# Patient Record
Sex: Male | Born: 1970 | Race: White | Hispanic: No | Marital: Single
Health system: Southern US, Community
[De-identification: ages and names within clinical notes are randomized; demographics above are authoritative.]

---

## 2013-06-01 ENCOUNTER — Inpatient Hospital Stay: Payer: Self-pay | Admitting: Specialist

## 2013-06-01 LAB — URINALYSIS, COMPLETE
Bacteria: NONE SEEN
Blood: NEGATIVE
Nitrite: NEGATIVE
Ph: 7 (ref 4.5–8.0)
Protein: NEGATIVE
Specific Gravity: 1.014 (ref 1.003–1.030)
WBC UR: 1 /HPF (ref 0–5)

## 2013-06-01 LAB — CBC
HCT: 44.9 % (ref 40.0–52.0)
HGB: 15 g/dL (ref 13.0–18.0)
MCH: 29.8 pg (ref 26.0–34.0)
MCHC: 33.4 g/dL (ref 32.0–36.0)
Platelet: 292 10*3/uL (ref 150–440)

## 2013-06-01 LAB — COMPREHENSIVE METABOLIC PANEL
Alkaline Phosphatase: 86 U/L (ref 50–136)
Anion Gap: 11 (ref 7–16)
Calcium, Total: 8.6 mg/dL (ref 8.5–10.1)
Creatinine: 0.97 mg/dL (ref 0.60–1.30)
EGFR (African American): >60
EGFR (Non-African Amer.): >60
Glucose: 174 mg/dL — ABNORMAL HIGH (ref 65–99)
Potassium: 2.7 mmol/L — ABNORMAL LOW (ref 3.5–5.1)
SGPT (ALT): 21 U/L (ref 12–78)

## 2013-06-01 LAB — PROTIME-INR
INR: 1
Prothrombin Time: 13.4 secs (ref 11.5–14.7)

## 2013-06-02 LAB — POTASSIUM: Potassium: 4.3 mmol/L (ref 3.5–5.1)

## 2013-06-03 LAB — HEMATOCRIT: HCT: 38.8 % — ABNORMAL LOW (ref 40.0–52.0)

## 2013-11-18 IMAGING — CR DG ANKLE COMPLETE 3+V*L*
1 series · 4 of 4 positions shown · non-contrast
Comparison: none

REASON FOR EXAM: ankle deformity s/p fall
COMMENTS:

[Series 1: ap · 0.17mm/px · 4 of 4 slices shown]
[im 1/4]
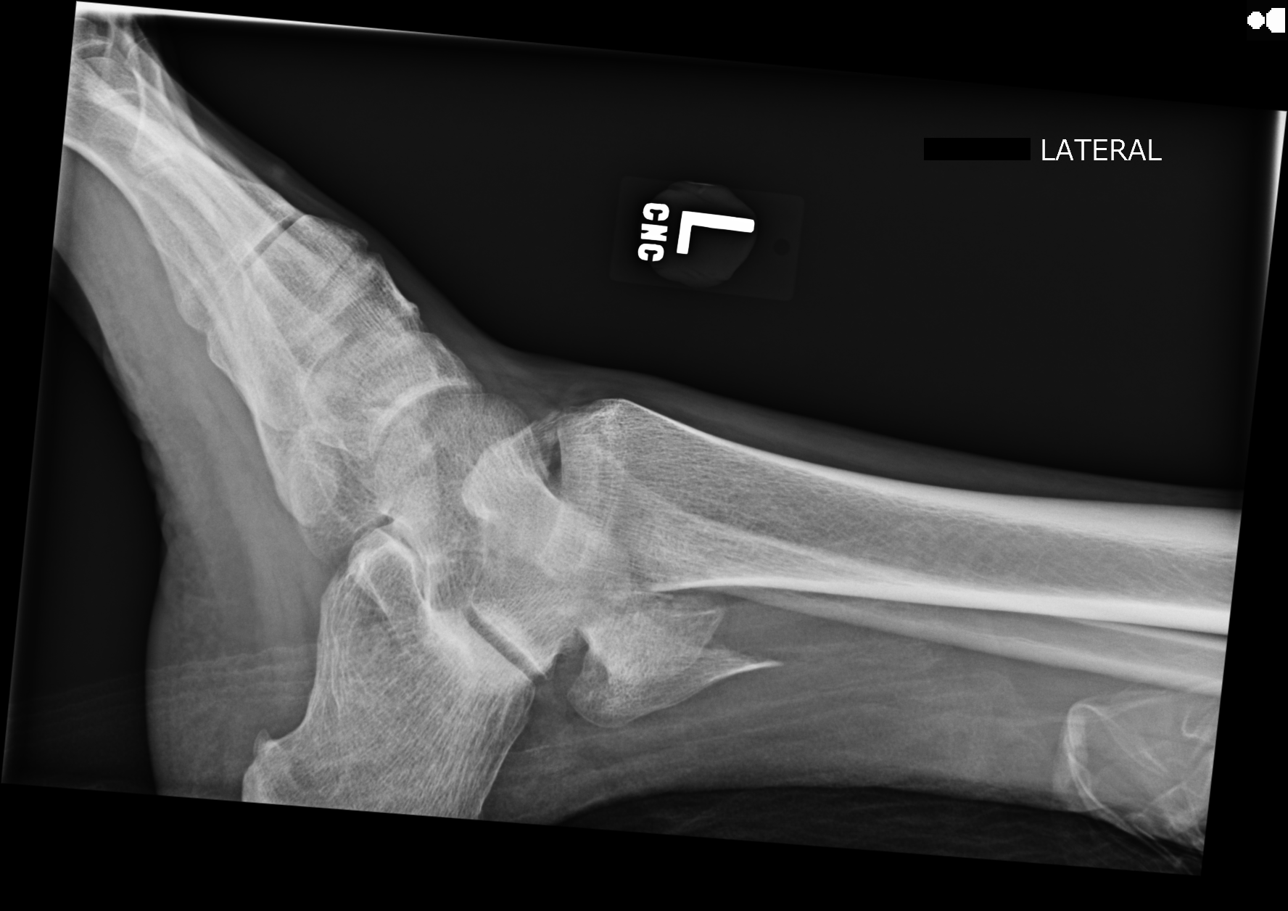
[im 2/4]
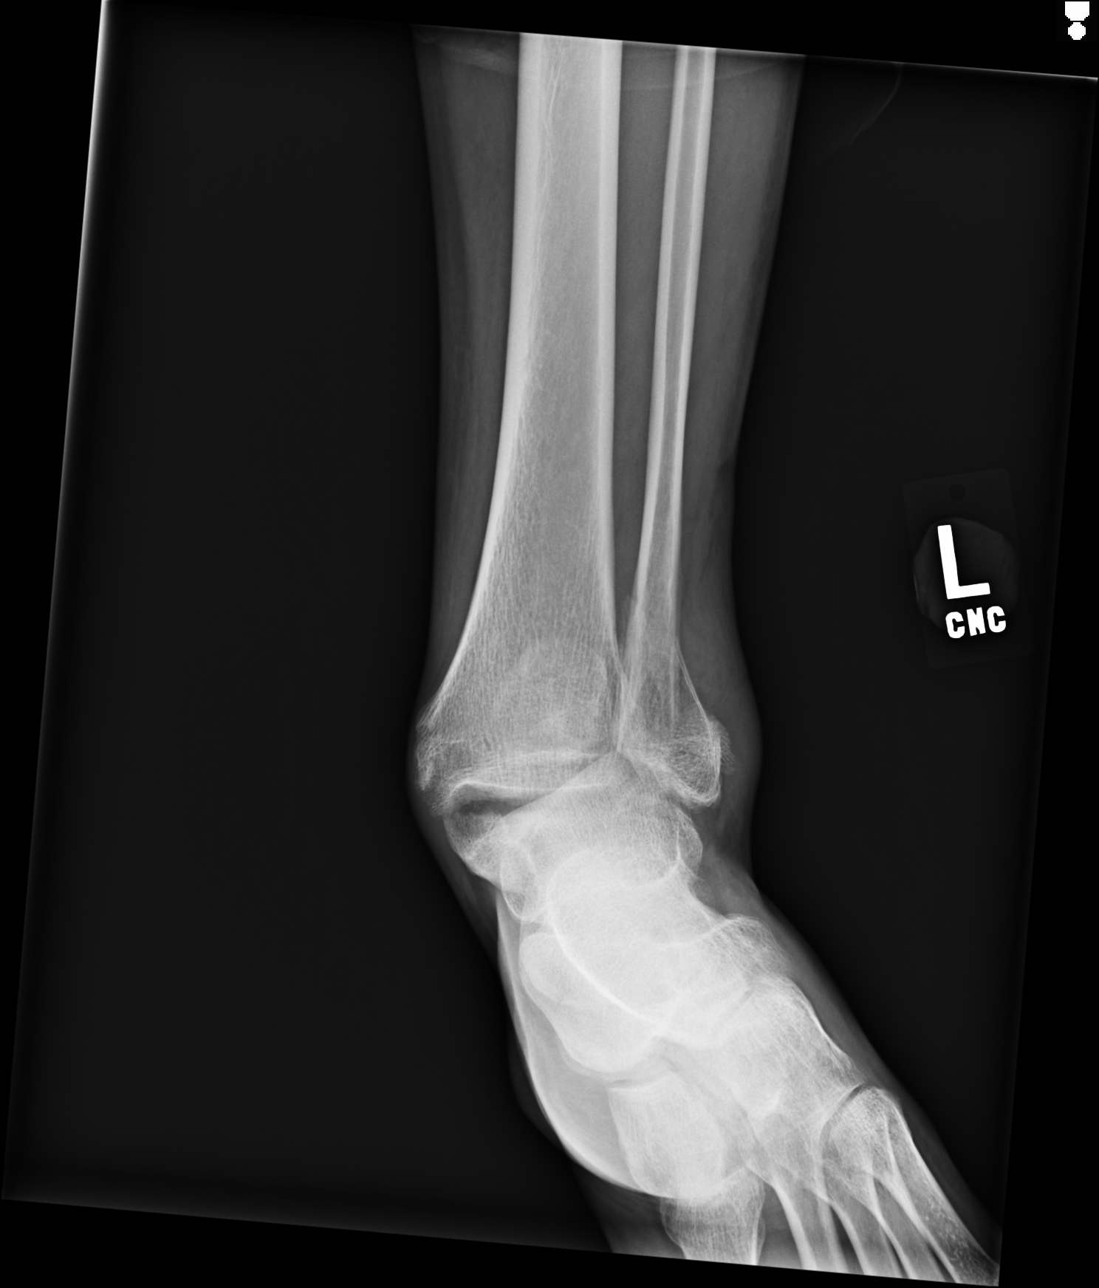
[im 3/4]
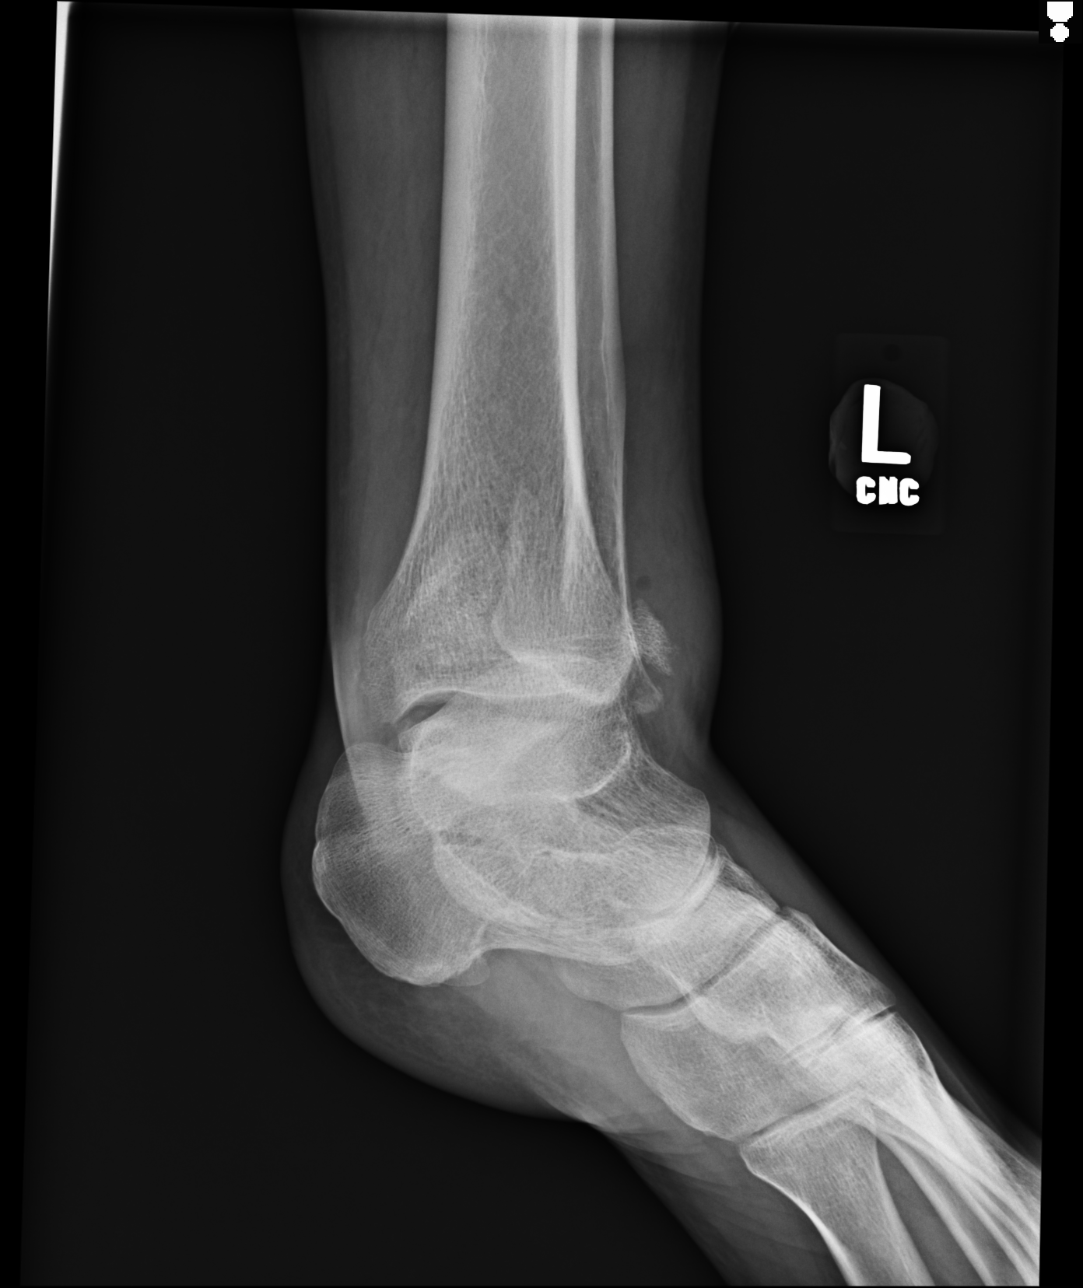
[im 4/4]
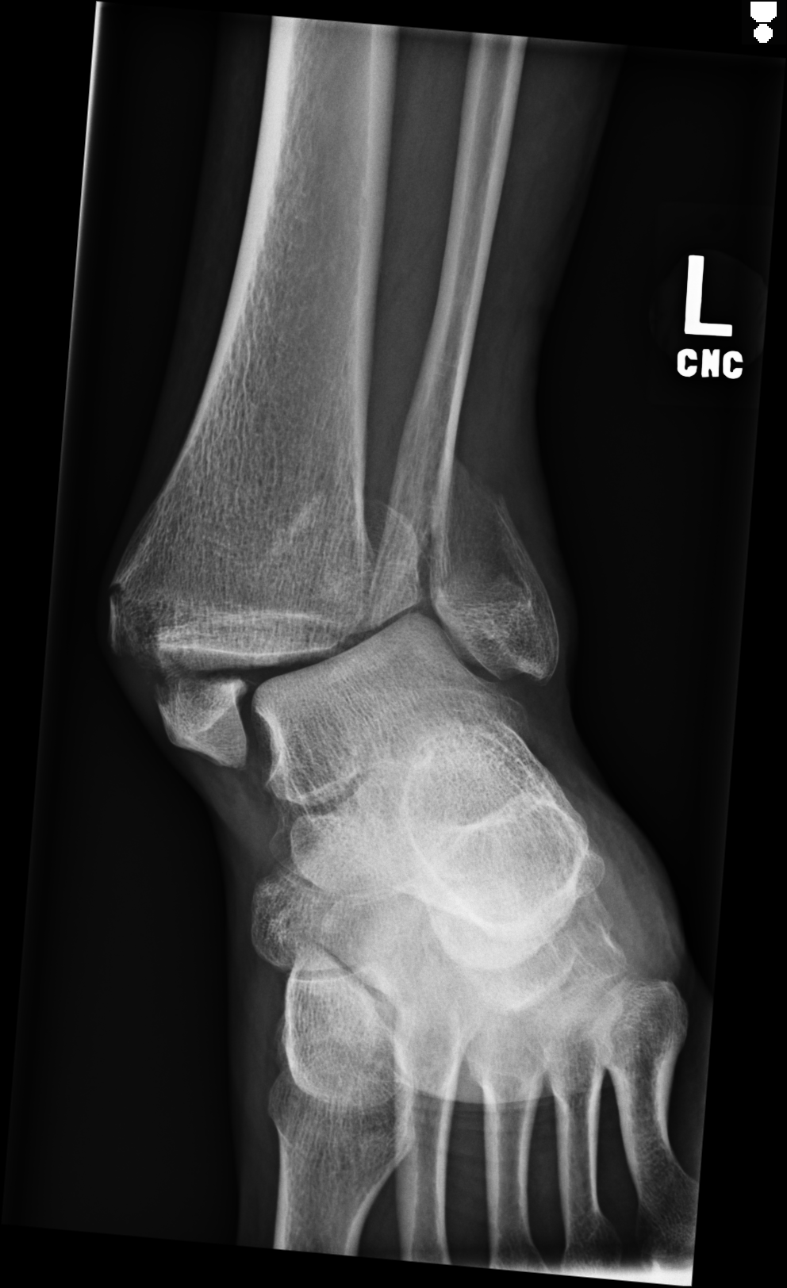

[4 of 4 positions shown; findings below may reference images not displayed]

PROCEDURE:     DXR - DXR ANKLE LEFT COMPLETE  - June 01, 2013  [DATE]

RESULT:     Four views of the left ankle reveal the patient to have
sustained a bimalleolar fracture. There is complete disruption of the ankle
joint mortise. The medial malleolus is displaced. The lateral malleolus is
displaced from the distal fibular shaft by at least one shaft width. The
talus and calcaneus are grossly intact.
IMPRESSION: The patient has sustained a displaced trimalleolar fracture
of the left ankle.

[REDACTED]

## 2015-04-15 NOTE — H&P (Signed)
   Subjective/Chief Complaint Pain left ankle   History of Present Illness 44 year old male injured the left ankle at work pulling on a machine.  Had immediate pain and deformity of the ankle and was brought to the Emergency Room where exam and X-rays show a displaced, unstable left trimalleolar ankle fracture dislocation. Discussed treatment with patient who agrees to surgical fixation.  Risks and benefits of surgery were discussed at length including but not limited to infection, non union, nerve or blood vessed damage, non union, need for repeat surgery, blood clots and lung emboli, and death.  Closed reduction and splinting carried out in Emergency Room.   Past Med/Surgical Hx:  GERD - Esophageal Reflux:   2 Back surgeries:   Wisdom Tooth Extraction:   ALLERGIES:  NKA: None  HOME MEDICATIONS: Medication Instructions Status  Zantac 150 mg oral tablet 1 tab(s) orally once a day Active  Norco 325 mg-5 mg oral tablet 1 tab(s) orally , As Needed - for Pain Active   Family and Social History:  Family History Non-Contributory   Social History negative tobacco, negative ETOH   Place of Living Home   Review of Systems:  Fever/Chills No   Cough No   Sputum No   Abdominal Pain No   Chest Pain No   Physical Exam:  GEN well developed, well nourished   HEENT pink conjunctivae   NECK supple   RESP normal resp effort   CARD regular rate   ABD denies tenderness   LYMPH negative neck   EXTR negative edema, Left ankle displced.  Skin intact.  circulation/sensation/motor function good.  pain withmovement.   SKIN normal to palpation   NEURO motor/sensory function intact   PSYCH alert, A+O to time, place, person, good insight   Radiology Results: XRay:    09-Jun-14 13:13, Ankle Left Complete  Ankle Left Complete  REASON FOR EXAM:    ankle deformity s/p fall  COMMENTS:       PROCEDURE: DXR - DXR ANKLE LEFT COMPLETE  - Jun 01 2013  1:13PM     RESULT: Four views of the  left ankle reveal the patient to have sustained   a bimalleolar fracture. There is complete disruption of the ankle joint   mortise. The medial malleolus is displaced. The lateral malleolus is   displaced from the distal fibular shaft by at least one shaft width. The   talus and calcaneus are grossly intact.    IMPRESSION:  The patient has sustained a displaced trimalleolar fracture   of the left ankle.     Dictation Site: 2    Verified By: DAVID A. SwazilandJORDAN, M.D., MD  LabUnknown:  PACS Image    Assessment/Admission Diagnosis Unstable trimalleolar fracture dislocation left ankle   Plan open reduction and internal fixation left ankle.   Electronic Signatures: Valinda HoarMiller, Tiesha Marich E (MD)  (Signed 09-Jun-14 16:43)  Authored: CHIEF COMPLAINT and HISTORY, PAST MEDICAL/SURGIAL HISTORY, ALLERGIES, HOME MEDICATIONS, FAMILY AND SOCIAL HISTORY, REVIEW OF SYSTEMS, PHYSICAL EXAM, Radiology, ASSESSMENT AND PLAN   Last Updated: 09-Jun-14 16:43 by Valinda HoarMiller, Edon Hoadley E (MD)

## 2015-04-15 NOTE — Op Note (Signed)
PATIENT NAME:  Jonathan Boyle, Jonathan Boyle MR#:  [id number] DATE OF BIRTH:  March 26, 1971  DATE OF PROCEDURE:  06/01/2013  PREOPERATIVE DIAGNOSIS: Unstable trimalleolar fracture-dislocation, left ankle.   POSTOPERATIVE DIAGNOSIS: Unstable trimalleolar fracture-dislocation, left ankle.   PROCEDURE PERFORMED: Open reduction and internal fixation of left trimalleolar ankle fracture.   SURGEON: Valinda HoarHoward E. Kimya Mccahill, M.D.   ANESTHESIA: Spinal.   COMPLICATIONS: None.   DRAINS: None.   ESTIMATED BLOOD LOSS: Minimal.   REPLACEMENTS: None.   DESCRIPTION OF PROCEDURE: The patient was brought to the operating room where he underwent satisfactory spinal anesthesia and was placed in the supine position on the operating room table. A bump was placed under the left hip. The left leg was prepped and draped in sterile fashion and an Esmarch applied. The tourniquet was inflated to 350 mmHg. Tourniquet time was 76 minutes. A longitudinal incision was made over the distal fibula and dissection carried out sharply down to bone. The periosteum was elevated and the fracture site identified. Irrigation was carried out to clear the fracture of blood. He had a low, short oblique fracture of the lateral malleolus with some comminution. This was reduced. Small fragments of bone were removed. It was held with a reduction forceps and a 24 mm compression screw was placed anterior to posterior. A 7-hole locking plate was then contoured to fit the distal fibula and affixed to the fibula laterally. One locking screw distally and 1 fully threaded cancellous screw was inserted distal to the fracture. Four cortical screws were placed proximally. This fixed the lateral malleolus quite securely. Fluoroscopy showed it to be reduced anatomically and the hardware to be in good position. This wound was irrigated and closed with zero quill suture and staples. A second incision was then made anteromedially over the medial malleolus. Dissection was carried  out bluntly in the subcutaneous tissue protecting the saphenous vein. The fracture was comminuted on the dorsal surface only. Irrigation was again carried out. The anterior portion of the joint was visualized and palpated and there were no further loose bodies. The fracture was reduced and held with a reduction forceps. Two guide pins were inserted and were seen to be in excellent position. Two 50 mm, 4.0 cannulated cancellous screws were then inserted and this seated tightly. Fluoroscopy showed these to be in good position and the medial side to be well fixed. The guide pins were removed. A small stab wound was made anterolaterally and dissection carried out bluntly through subcutaneous tissue down to the bone. Periosteal elevator was placed across the front of the joint as protection. A guide was inserted and it was seen to be in good position anterior to posterior catching the posterior fragment. This was not extremely large, but I felt that 1 screw should be placed in it.  This was drilled and a 48 mm, 4.0 cannulated cancellous screw was inserted fixing posterior fragment. The wounds were all irrigated. Final fluoroscopy showed all hardware to be in good position and the joint to be well reduced. The medial side was closed with 3-0 Vicryl and staples as was the anterior stab wound. The joint was infiltrated with 30 mL of 0.5% Marcaine. Dry sterile dressings and posterior splint were applied and the tourniquet was deflated with good return of blood flow to the foot. The patient was transferred to a hospital bed and taken to recovery in good condition.  ____________________________ Valinda HoarHoward E. Teela Narducci, MD hem:sb D: 06/01/2013 23:31:01 ET T: 06/02/2013 07:15:49 ET JOB#: [id number]  cc: Claris PongHoward E.  Hyacinth Meeker, MD, <Dictator> Valinda Hoar MD ELECTRONICALLY SIGNED 06/02/2013 17:38

## 2015-04-15 NOTE — Discharge Summary (Signed)
PATIENT NAME:  Jonathan Boyle, Jonathan Boyle MR#:  [id number] DATE OF BIRTH:  Dec 20, 1971  DATE OF ADMISSION:  06/01/2013 DATE OF DISCHARGE:  06/03/2013  FINAL DIAGNOSES: 1.  Displaced comminuted trimalleolar fracture-dislocation. 2.  Esophageal reflux.   OPERATION: On 06/01/2013, open reduction and internal fixation of left trimalleolar ankle fracture.   COMPLICATIONS: None.   CONSULTATIONS: None.   DISCHARGE MEDICATIONS: Oxycodone 5/325 q.6 h. p.r.n. pain, enteric-coated aspirin 1 p.o. b.i.d., Mobic 15 mg daily, Neurontin 400 mg b.i.d., Zantac 150 mg daily as needed.   HISTORY OF PRESENT ILLNESS: The patient is a 44 year old male who injured his left ankle at work while pulling on a machine. He had immediate pain and deformity and was brought to the Emergency Room where exam and x-ray showed a displaced unstable left trimalleolar ankle fracture-dislocation. He was admitted for surgical fixation. The fracture was reduced and splinted in the Emergency Room.   PAST MEDICAL HISTORY:  ILLNESSES: As above.  OPERATIONS: Two back surgeries, wisdom tooth extraction.   ALLERGIES: None.   HOME MEDICATIONS: Zantac.   REVIEW OF SYSTEMS: Unremarkable.   FAMILY HISTORY: Unremarkable.   SOCIAL HISTORY: The patient does not smoke and lives at home.  PHYSICAL EXAMINATION: Healthy male in no distress. The left ankle was severely deformed. The skin was intact. Neurovascular status was good.    LABORATORY DATA: On admission was satisfactory.   HOSPITAL COURSE: Later on the day of 06/01/2013, he was taken to surgery and had open reduction and internal fixation of the left ankle. Plate and screws were placed laterally, 2 cancellous screws medially and 1 cancellous screw anterior-posterior. Postoperatively he did well with moderate pain. He was ambulated. The dressings were changed on 06/03/2013 and the wound was clean. He was placed in a short leg cast. He was discharged nonweightbearing on the left. He will ice  and elevate this. He will return to the office in 2 weeks for exam. He will remain out of work.    ____________________________ Valinda HoarHoward E. Kasiyah Platter, MD hem:jm D: 06/03/2013 13:08:35 ET T: 06/03/2013 16:03:27 ET JOB#: [id number]  cc: Valinda HoarHoward E. Makenah Karas, MD, <Dictator> Marcine Matarharles W. Phillips Jr., MD Valinda HoarHOWARD E Karolina Zamor MD ELECTRONICALLY SIGNED 06/04/2013 10:14
